# Patient Record
Sex: Female | Born: 1935 | Race: White | Hispanic: No
Health system: Southern US, Community
[De-identification: ages and names within clinical notes are randomized; demographics above are authoritative.]

---

## 2004-04-28 ENCOUNTER — Ambulatory Visit: Payer: Self-pay | Admitting: Family Medicine

## 2004-07-03 ENCOUNTER — Other Ambulatory Visit: Payer: Self-pay

## 2004-07-03 ENCOUNTER — Emergency Department: Payer: Self-pay | Admitting: Emergency Medicine

## 2005-04-29 ENCOUNTER — Ambulatory Visit: Payer: Self-pay | Admitting: Family Medicine

## 2006-05-06 ENCOUNTER — Ambulatory Visit: Payer: Self-pay | Admitting: Internal Medicine

## 2006-09-23 ENCOUNTER — Inpatient Hospital Stay: Payer: Self-pay | Admitting: Internal Medicine

## 2006-09-23 ENCOUNTER — Other Ambulatory Visit: Payer: Self-pay

## 2006-10-28 ENCOUNTER — Ambulatory Visit: Payer: Self-pay | Admitting: Internal Medicine

## 2006-11-24 ENCOUNTER — Ambulatory Visit: Payer: Self-pay | Admitting: Internal Medicine

## 2007-07-21 ENCOUNTER — Ambulatory Visit: Payer: Self-pay | Admitting: Internal Medicine

## 2007-08-24 ENCOUNTER — Ambulatory Visit: Payer: Self-pay | Admitting: Ophthalmology

## 2007-08-24 ENCOUNTER — Other Ambulatory Visit: Payer: Self-pay

## 2007-09-28 ENCOUNTER — Ambulatory Visit: Payer: Self-pay | Admitting: Ophthalmology

## 2008-08-23 ENCOUNTER — Emergency Department: Payer: Self-pay | Admitting: Emergency Medicine

## 2008-09-27 ENCOUNTER — Ambulatory Visit: Payer: Self-pay | Admitting: Internal Medicine

## 2009-05-20 ENCOUNTER — Ambulatory Visit: Payer: Self-pay | Admitting: Internal Medicine
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
## 2010-03-06 IMAGING — CR DG CHEST 2V
1 series · 2 of 2 positions shown · non-contrast
Comparison: none

REASON FOR EXAM: chest pain
COMMENTS:

[Series 1: view not recorded · 0.17mm/px · 2 of 2 slices shown]
[im 1/2]
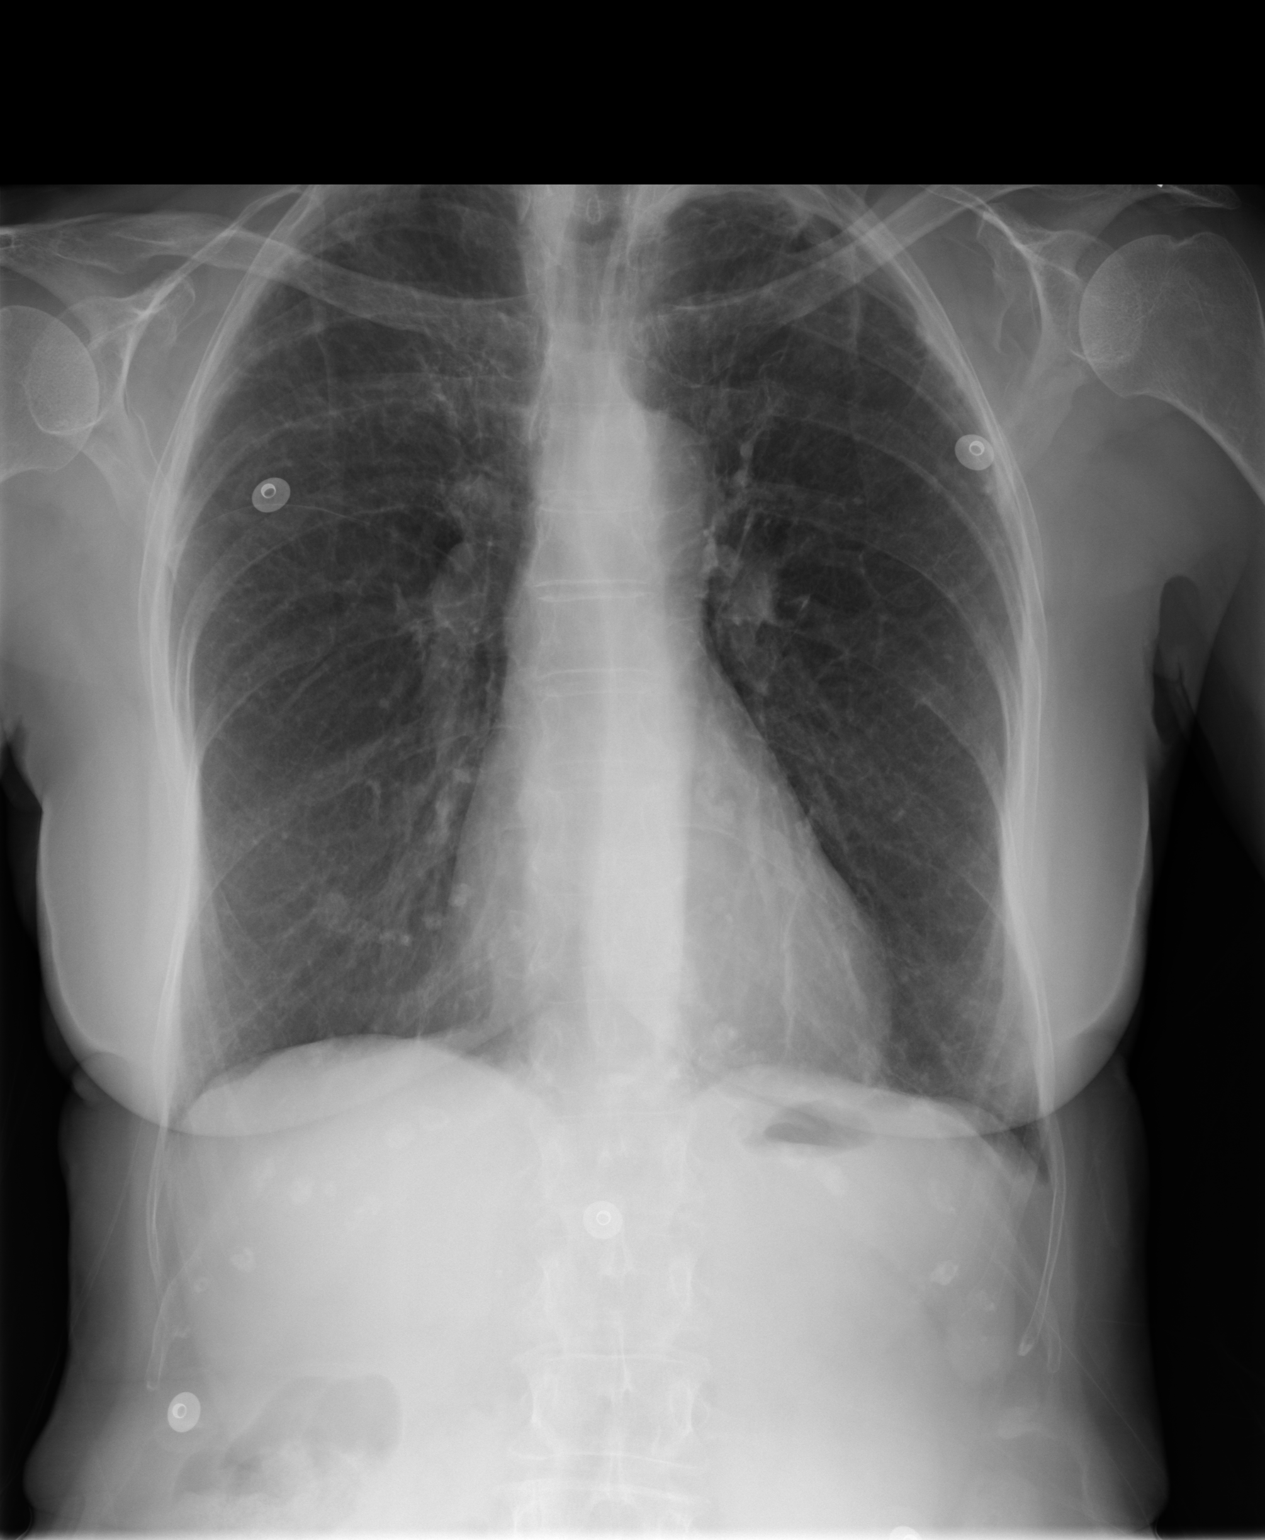
[im 2/2]
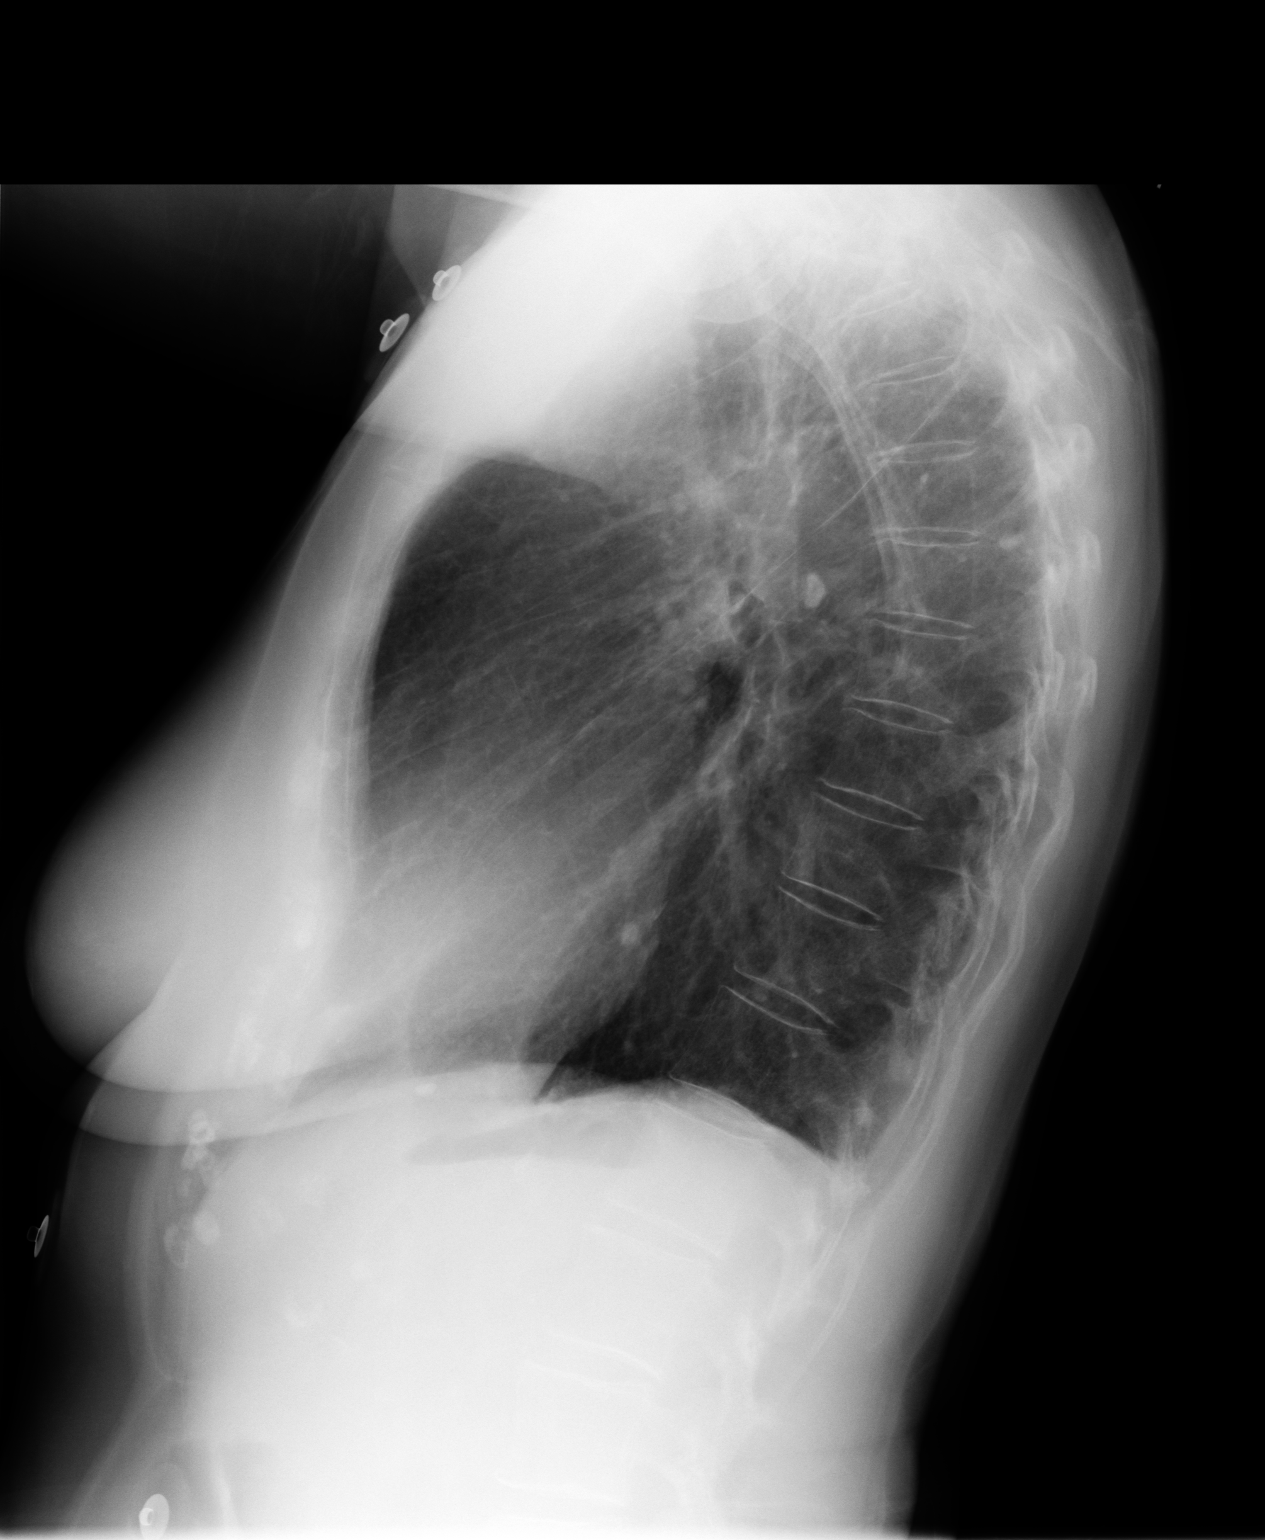

[2 of 2 positions shown; findings below may reference images not displayed]

PROCEDURE:     DXR - DXR CHEST PA (OR AP) AND LATERAL  - August 23, 2008  [DATE]

RESULT:     Comparison is made to the study of 09/23/2006. The lungs are
hyperinflated suggestive of COPD. Apical fibrosis is present bilaterally.
There is a nodular density in the lower portion of the RIGHT lung which may
represent a nipple shadow or a possible pulmonary nodule. On the lateral
view, there is nodular density projecting over the posterior heart border.
Followup CT would be recommended. This may represent a calcified granuloma.
There is no infiltrate, effusion or edema.
IMPRESSION: Possible granulomatous disease versus solitary pulmonary nodule superimposed
on mild COPD. Followup CT is suggested.

## 2010-11-10 ENCOUNTER — Ambulatory Visit: Payer: Self-pay | Admitting: Internal Medicine

## 2014-04-09 ENCOUNTER — Emergency Department: Payer: Self-pay | Admitting: Emergency Medicine

## 2014-08-22 ENCOUNTER — Inpatient Hospital Stay: Payer: Self-pay | Admitting: Internal Medicine

## 2014-11-11 NOTE — H&P (Signed)
PATIENT NAME:  Michelle Henson, Michelle Henson MR#:  [id number] DATE OF BIRTH:  1927/10/20  DATE OF ADMISSION:  08/22/2014  PRIMARY CARE PROVIDER: Barbette Reichmann, MD  EMERGENCY DEPARTMENT REFERRING PHYSICIAN: Cecille Amsterdam. Mayford Knife, MD   CHIEF COMPLAINT: Fever. Decrease in responsiveness.   HISTORY OF PRESENT ILLNESS: The patient is an 81 year old assisted living resident at [facility] with history of hypertension, Alzheimer dementia, hyperlipidemia, and osteoporosis, who was sent from there because she had a fever and had altered mental status and tachycardia. The patient was sent with these symptoms. She has been treated with azithromycin as an outpatient. She was noted to have a temperature of 100.9 here, and a chest x-ray suggestive of pneumonia. The patient is unable to give me any further review of systems due to her altered mental status.   PAST MEDICAL HISTORY:  1.  Hyperlipidemia.  2.  Osteoporosis,  3.  Alzheimer dementia.  4.  Hypertension.   ALLERGIES: FOSAMAX.   MEDICATIONS: Zithromax taper, vitamin D3 1000 international units daily, Systane Ultra 1 drop to each eye 3 times a day, simvastatin 20 q. daily, potassium chloride 10 mg 1 tablet p.o. q. daily, milk of magnesia 8% 30 mL every other day, amantadine 10 mg 1 tablet p.o. b.i.d., lisinopril 40 daily, Ketotifen ophthalmic 1 drop to each eye 2 times a day, gabapentin 100 mg 1 tablet p.o. daily, Lasix 20 daily, Donepezil 10 mg q. daily, dexamethasone neomycin/Polymyxin B ophthalmic solution 1 application to each eye on Mondays and Fridays, Cetaphil lotion as needed, calcium carbonate 600 mg 1 tablet p.o. b.i.d. amlodipine 10 daily, Tylenol 500 q. 4-6 hours p.r.n., ABC Plus Senior Vitamins 1 tablet p.o. q. daily.   SOCIAL HISTORY: It is unclear whether she smokes or drinks. Currently resides in an assisted living.   FAMILY HISTORY: Unobtainable.   REVIEW OF SYSTEMS: Unobtainable due to the patient being confused.   PHYSICAL EXAMINATION:  VITAL  SIGNS: Temperature 100.9, pulse 105, respirations 18, blood pressure 125/78, oxygen 91%. GENERAL: The patient is a well-developed female in no acute distress.   HEENT: Head atraumatic, normocephalic. Pupils equally round and reactive to light and accommodation. There is no conjunctival pallor. No scleral icterus. Nasal exam shows no drainage or ulceration. Oropharynx is clear, without any exudate.  NECK: Supple without any thyromegaly.  CARDIOVASCULAR: Regular rate and rhythm. No murmurs, rubs, clicks, or gallops.  LUNGS: Has bilateral rhonchi in both upper lobes without any accessory muscle usage.  ABDOMEN: Soft, nontender, nondistended. Positive bowel sounds x 4. No hepatosplenomegaly.  EXTREMITIES: No clubbing, cyanosis or edema.  SKIN: No rash.  LYMPH NODES: None palpable.  MUSCULOSKELETAL: There is no erythema or swelling.  VASCULAR: Good DP and PT pulses.  PSYCHIATRIC: Not anxious or depressed.  NEUROLOGIC: Moving all 4 extremities spontaneously.   EVALUATIONS: Chest x-ray shows low inspiratory volumes, increased patchy airspace opacity in bilateral upper lungs, stable chronic bronchitis changes.   Glucose 101, BUN 33, creatinine 1.24, sodium 140, potassium 3.9, chloride 105, CO2 of 28, magnesium 2.0. LFTs are normal, except albumin of 3.2, troponin 0.02. WBC 12.0, hemoglobin 13.1, platelet count 225,000. INR 1.0. Blood 1+, bacteria 3+.   ASSESSMENT AND PLAN: The patient is an 81 year old white female with dementia, hypertension, and hyperlipidemia, who presents to the ED with fever, altered mental status.  1.  Fever and altered mental status, likely due to pneumonia. At this time, we will treat her with IV antibiotics with Levaquin. Swallow evaluation.  2.  Hypertension. Continue Norvasc. I will  hold her lisinopril.  3.  Alzheimer dementia. Continue Namenda as taking at home.  4.  Hyperlipidemia. Continue simvastatin.  5.  Miscellaneous. The patient will be on Lovenox for deep vein  thrombosis prophylaxis.   TIME SPENT: 50 minutes on this patient.     ____________________________ Lacie ScottsShreyang H. Allena KatzPatel, MD shp:MT D: 08/22/2014 16:15:53 ET T: 08/22/2014 16:38:16 ET JOB#: [id number]  cc: Wanell Lorenzi H. Allena KatzPatel, MD, <Dictator> Charise CarwinSHREYANG H Tayshon Winker MD ELECTRONICALLY SIGNED 08/31/2014 15:53

## 2014-11-11 NOTE — Discharge Summary (Signed)
PATIENT NAME:  Michelle Henson, Michelle Henson MR#:  [id number] DATE OF BIRTH:  1928-03-04  DATE OF ADMISSION:  08/22/2014 DATE OF DISCHARGE:    DISCHARGE DIAGNOSES:  1.  Altered mental status with encephalopathy secondary to pneumonia.  2.  Hypertension.  3.  Dementia.  4.  Hyperlipidemia.   CHIEF COMPLAINT: Fever and decreased responsiveness.   HISTORY OF PRESENT ILLNESS: Michelle Henson is an 81 year old female who lives in an assisted living facility with history of hypertension, dementia, hyperlipidemia and osteoporosis who presented to the ED after she was noted to have a fever with altered mental status and tachycardia and chest x-ray was suggestive of pneumonia. The patient also had a fever with temperature of 100.9.   PAST MEDICAL HISTORY: Significant for hyperlipidemia, osteoporosis, Alzheimer disease and hypertension. Please see H and P for full details.   HOSPITAL COURSE: [facility] and received IV antibiotics with Levaquin. Initial labs showed WBC count of 12, hemoglobin 13.1, and platelet count of 225,000. Creatinine 1.24, glucose 101, BUN 33, sodium 140, and potassium 3.9. Chest x-ray showed low inspiratory volumes and increased patchy airspace disease bilaterally in the upper lungs with stable chronic bronchitic changes. During her stay in the hospital, the patient gradually improved and mental status also cleared. However, she still was confused from her dementia. Respiratory status wise she was stable. Repeat chest x-ray showed improvement in infiltrate. The patient was discharged in stable condition on the following medications.   DISCHARGE MEDICATIONS:  Levaquin 250 mg p.o. daily for 7 days, Milk of Magnesia 30 mL every other day, furosemide 20 mg once a day as needed for edema, Tylenol 500 mg q. 4 to 6 hours p.r.n., simvastatin 20 mg once a day, donepezil 10 mg once a day, Systane ophthalmic solution 1 drop to each eye 3 times a day, ketotifen ophthalmic drops 1 drop in each eye  b.i.d., calcium carbonate 600 mg p.o. b.i.d., vitamin D3 1000 once a day, KCl 10 mEq once a day, lisinopril 40 mg once a day, Gabapentin 100 mg once a day, amlodipine 10 mg once a day, ABC Plus Senior multivitamins 1 tablet once a day, and dexamethasone/neomycin/polymyxin B ophthalmic ointment 1 application to each eye once a day at bedtime on Mondays and Fridays.   DISCHARGE INSTRUCTIONS: The patient was advised to stop taking amantadine and she has been advised to follow with me, Dr. Marcello FennelHande, in 1 to 2 weeks' time with a repeat chest x-ray soon.  She was stable at the time of discharge.   TOTAL TIME SPENT ON DISCHARGE: 35 minutes.  ____________________________ Barbette ReichmannVishwanath Shamal Stracener, MD vh:sb D: 08/24/2014 13:16:29 ET T: 08/24/2014 14:37:12 ET JOB#: [id number]  cc: Barbette ReichmannVishwanath Zairah Arista, MD, <Dictator> Barbette ReichmannVISHWANATH Nasario Czerniak MD ELECTRONICALLY SIGNED 09/05/2014 13:22

## 2015-06-13 DEATH — deceased

## 2016-03-04 IMAGING — CR DG CHEST 1V PORT
1 series · 1 of 1 positions shown · non-contrast
Comparison: Prior chest x-ray 08/23/2008

CLINICAL DATA: 86-year-old female with sepsis, fever and
tachycardia

EXAM:
PORTABLE CHEST - 1 VIEW

[ap]
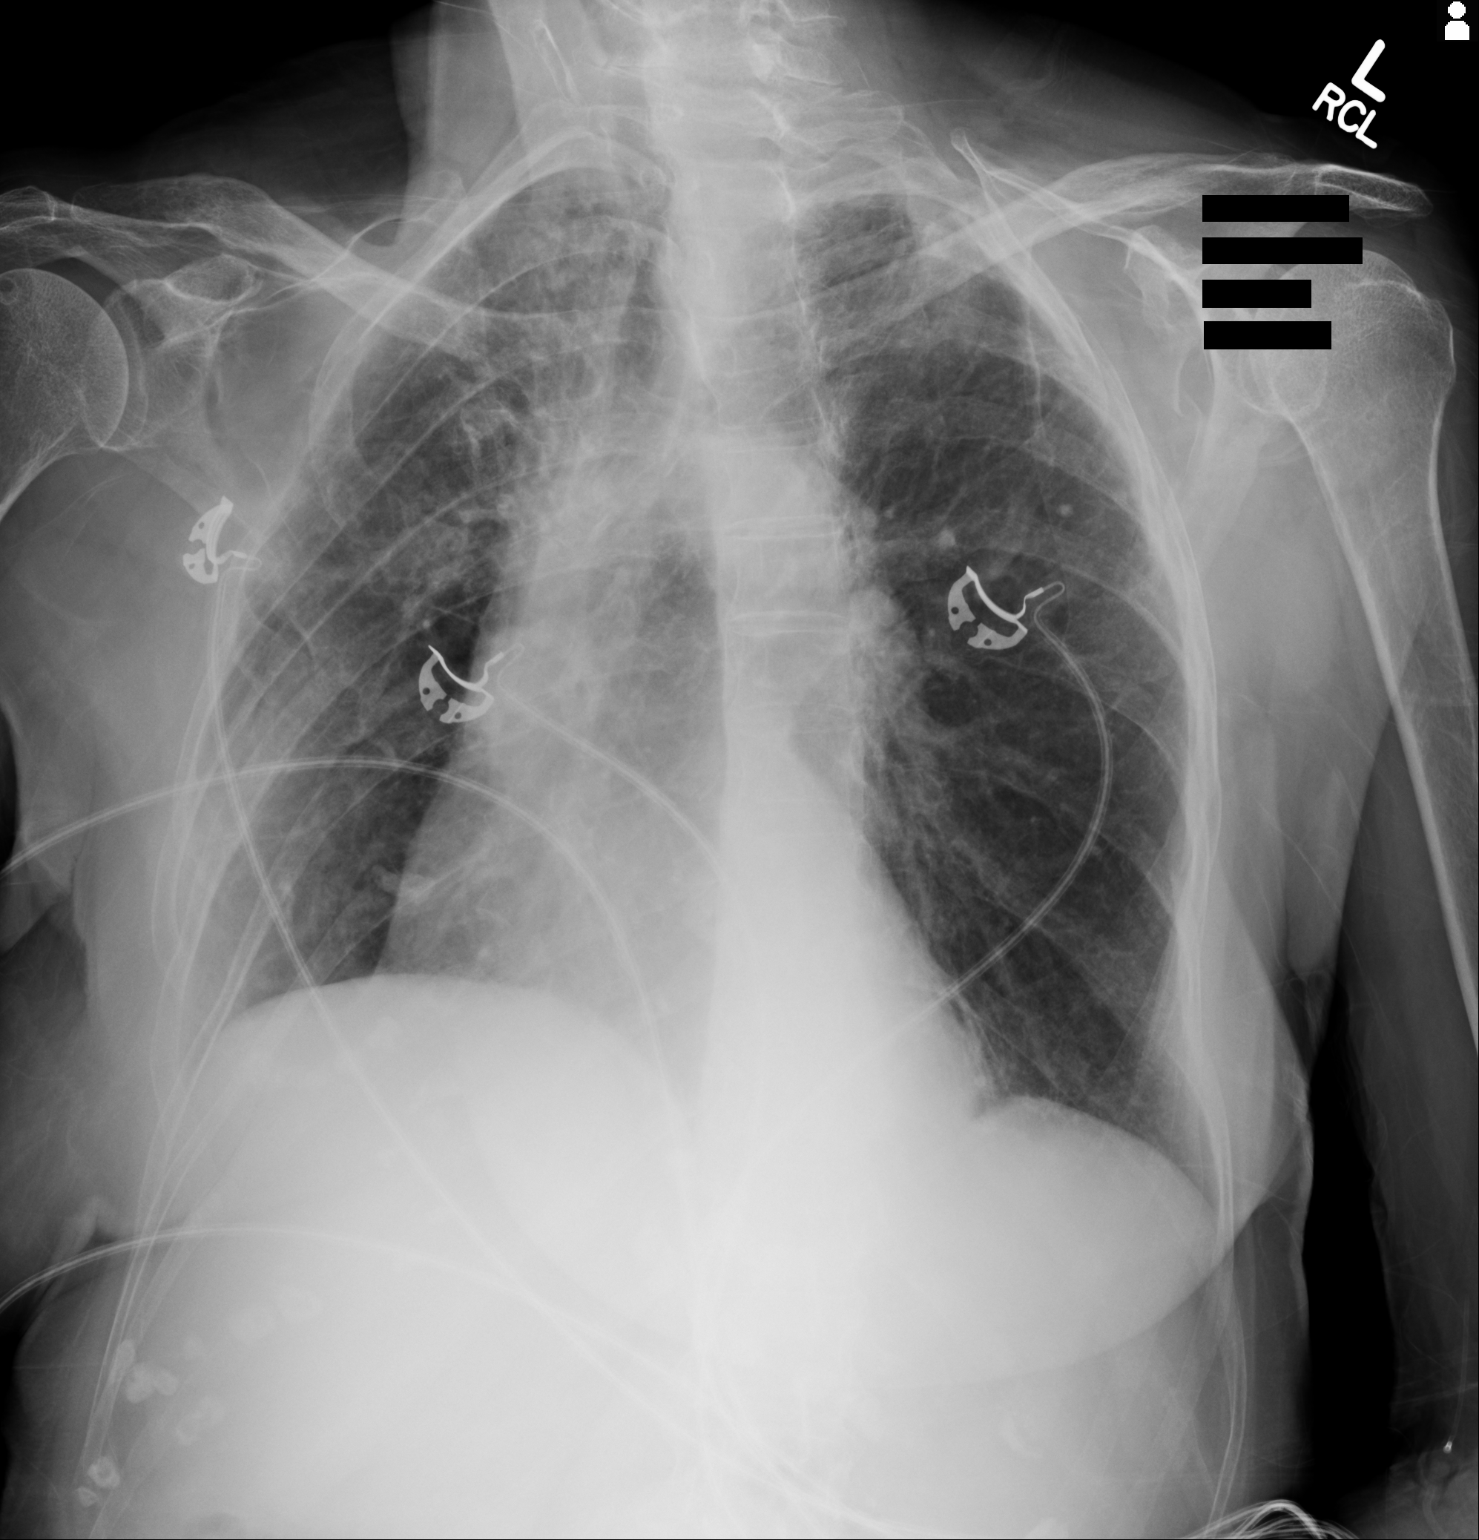

[1 of 1 positions shown; findings below may reference images not displayed]

FINDINGS: The patient is rotated toward the right which distorts the cardiac
and mediastinal contours. Within these limitations, the heart and
mediastinum appear unchanged compared to prior. Interval development
of patchy opacity in the bilateral upper lungs. Inspiratory volumes
are relatively low. Background bronchitic changes and interstitial
prominence similar compared to prior. No acute osseous abnormality.
IMPRESSION: 1. Low inspiratory volumes.
2. Increased patchy airspace opacity in the bilateral upper lungs
favored to reflect atelectasis and crowding of the pulmonary
vasculature given the lower lung volumes. Bilateral upper lung
pneumonia is a less likely possibility.
3. Stable chronic bronchitic changes and interstitial prominence.
